# Patient Record
Sex: Female | Born: 1945 | Race: Black or African American | Hispanic: No | Marital: Married | Smoking: Never smoker
Health system: Southern US, Community
[De-identification: ages and names within clinical notes are randomized; demographics above are authoritative.]

## PROBLEM LIST (undated history)

## (undated) DIAGNOSIS — N63 Unspecified lump in unspecified breast: Secondary | ICD-10-CM

## (undated) HISTORY — PX: BREAST EXCISIONAL BIOPSY: SUR124

## (undated) HISTORY — PX: ABDOMINAL HYSTERECTOMY: SHX81

---

## 1987-11-07 DIAGNOSIS — N63 Unspecified lump in unspecified breast: Secondary | ICD-10-CM

## 1987-11-07 HISTORY — DX: Unspecified lump in unspecified breast: N63.0

## 2004-10-17 ENCOUNTER — Ambulatory Visit: Payer: Self-pay | Admitting: Internal Medicine

## 2005-10-24 ENCOUNTER — Ambulatory Visit: Payer: Self-pay | Admitting: Internal Medicine

## 2006-11-21 ENCOUNTER — Ambulatory Visit: Payer: Self-pay | Admitting: Internal Medicine

## 2007-08-13 ENCOUNTER — Ambulatory Visit: Payer: Self-pay | Admitting: Family Medicine

## 2007-11-27 ENCOUNTER — Ambulatory Visit: Payer: Self-pay | Admitting: Internal Medicine

## 2008-07-03 ENCOUNTER — Ambulatory Visit: Payer: Self-pay | Admitting: Surgery

## 2009-12-07 ENCOUNTER — Emergency Department: Payer: Self-pay | Admitting: Emergency Medicine

## 2009-12-14 ENCOUNTER — Ambulatory Visit: Payer: Self-pay | Admitting: Nurse Practitioner

## 2011-02-28 ENCOUNTER — Ambulatory Visit: Payer: Self-pay | Admitting: Internal Medicine

## 2011-08-22 ENCOUNTER — Ambulatory Visit: Payer: Self-pay

## 2012-03-11 ENCOUNTER — Ambulatory Visit: Payer: Self-pay | Admitting: Internal Medicine

## 2012-10-08 ENCOUNTER — Ambulatory Visit: Payer: Self-pay | Admitting: Family Medicine

## 2013-12-17 ENCOUNTER — Ambulatory Visit: Payer: Self-pay | Admitting: Family Medicine

## 2014-04-13 DIAGNOSIS — I340 Nonrheumatic mitral (valve) insufficiency: Secondary | ICD-10-CM | POA: Insufficient documentation

## 2014-04-21 DIAGNOSIS — R9431 Abnormal electrocardiogram [ECG] [EKG]: Secondary | ICD-10-CM | POA: Insufficient documentation

## 2016-05-01 ENCOUNTER — Other Ambulatory Visit: Payer: Self-pay | Admitting: Family Medicine

## 2016-05-01 DIAGNOSIS — Z1231 Encounter for screening mammogram for malignant neoplasm of breast: Secondary | ICD-10-CM

## 2016-05-15 ENCOUNTER — Ambulatory Visit
Admission: RE | Admit: 2016-05-15 | Discharge: 2016-05-15 | Disposition: A | Discharge status: Home / Self Care | Payer: Medicare PPO | Source: Ambulatory Visit | Attending: Family Medicine | Admitting: Family Medicine

## 2016-05-15 DIAGNOSIS — Z1231 Encounter for screening mammogram for malignant neoplasm of breast: Secondary | ICD-10-CM | POA: Insufficient documentation

## 2016-05-15 HISTORY — DX: Unspecified lump in unspecified breast: N63.0

## 2017-12-25 ENCOUNTER — Other Ambulatory Visit: Payer: Self-pay | Admitting: Family Medicine

## 2017-12-25 DIAGNOSIS — Z1231 Encounter for screening mammogram for malignant neoplasm of breast: Secondary | ICD-10-CM

## 2017-12-31 ENCOUNTER — Encounter (INDEPENDENT_AMBULATORY_CARE_PROVIDER_SITE_OTHER): Payer: Self-pay

## 2017-12-31 ENCOUNTER — Ambulatory Visit
Admission: RE | Admit: 2017-12-31 | Discharge: 2017-12-31 | Disposition: A | Discharge status: Home / Self Care | Payer: Medicare PPO | Source: Ambulatory Visit | Attending: Family Medicine | Admitting: Family Medicine

## 2017-12-31 DIAGNOSIS — Z1231 Encounter for screening mammogram for malignant neoplasm of breast: Secondary | ICD-10-CM | POA: Insufficient documentation

## 2017-12-31 DIAGNOSIS — R921 Mammographic calcification found on diagnostic imaging of breast: Secondary | ICD-10-CM | POA: Insufficient documentation

## 2018-01-03 ENCOUNTER — Other Ambulatory Visit: Payer: Self-pay | Admitting: Family Medicine

## 2018-01-03 DIAGNOSIS — R928 Other abnormal and inconclusive findings on diagnostic imaging of breast: Secondary | ICD-10-CM

## 2018-01-03 DIAGNOSIS — R921 Mammographic calcification found on diagnostic imaging of breast: Secondary | ICD-10-CM

## 2018-01-15 ENCOUNTER — Ambulatory Visit
Admission: RE | Admit: 2018-01-15 | Discharge: 2018-01-15 | Disposition: A | Discharge status: Home / Self Care | Payer: Medicare PPO | Source: Ambulatory Visit | Attending: Family Medicine | Admitting: Family Medicine

## 2018-01-15 DIAGNOSIS — R928 Other abnormal and inconclusive findings on diagnostic imaging of breast: Secondary | ICD-10-CM

## 2018-01-15 DIAGNOSIS — R921 Mammographic calcification found on diagnostic imaging of breast: Secondary | ICD-10-CM | POA: Diagnosis not present

## 2018-01-16 ENCOUNTER — Other Ambulatory Visit: Payer: Self-pay | Admitting: Family Medicine

## 2018-01-16 DIAGNOSIS — R928 Other abnormal and inconclusive findings on diagnostic imaging of breast: Secondary | ICD-10-CM

## 2018-01-16 DIAGNOSIS — R921 Mammographic calcification found on diagnostic imaging of breast: Secondary | ICD-10-CM

## 2018-01-24 ENCOUNTER — Ambulatory Visit
Admission: RE | Admit: 2018-01-24 | Discharge: 2018-01-24 | Disposition: A | Discharge status: Home / Self Care | Payer: Medicare PPO | Source: Ambulatory Visit | Attending: Family Medicine | Admitting: Family Medicine

## 2018-01-24 DIAGNOSIS — R928 Other abnormal and inconclusive findings on diagnostic imaging of breast: Secondary | ICD-10-CM

## 2018-01-24 DIAGNOSIS — R921 Mammographic calcification found on diagnostic imaging of breast: Secondary | ICD-10-CM

## 2018-01-24 HISTORY — PX: BREAST BIOPSY: SHX20

## 2018-01-25 LAB — SURGICAL PATHOLOGY

## 2018-03-04 DIAGNOSIS — H2510 Age-related nuclear cataract, unspecified eye: Secondary | ICD-10-CM | POA: Insufficient documentation

## 2018-03-04 DIAGNOSIS — H35341 Macular cyst, hole, or pseudohole, right eye: Secondary | ICD-10-CM | POA: Insufficient documentation

## 2018-03-04 DIAGNOSIS — H35372 Puckering of macula, left eye: Secondary | ICD-10-CM | POA: Insufficient documentation

## 2019-11-10 ENCOUNTER — Other Ambulatory Visit: Payer: Self-pay | Admitting: Family Medicine

## 2019-11-10 DIAGNOSIS — Z780 Asymptomatic menopausal state: Secondary | ICD-10-CM

## 2019-11-11 ENCOUNTER — Other Ambulatory Visit: Payer: Self-pay

## 2019-11-11 ENCOUNTER — Ambulatory Visit: Admission: RE | Admit: 2019-11-11 | Payer: Self-pay | Source: Ambulatory Visit

## 2019-11-13 ENCOUNTER — Other Ambulatory Visit: Payer: Self-pay | Admitting: Family Medicine

## 2019-11-19 ENCOUNTER — Other Ambulatory Visit: Payer: Self-pay | Admitting: Family Medicine

## 2019-11-19 DIAGNOSIS — R921 Mammographic calcification found on diagnostic imaging of breast: Secondary | ICD-10-CM

## 2020-02-12 ENCOUNTER — Ambulatory Visit
Admission: RE | Admit: 2020-02-12 | Discharge: 2020-02-12 | Disposition: A | Discharge status: Home / Self Care | Payer: Medicare HMO | Source: Ambulatory Visit | Attending: Family Medicine | Admitting: Family Medicine

## 2020-02-12 DIAGNOSIS — R921 Mammographic calcification found on diagnostic imaging of breast: Secondary | ICD-10-CM

## 2021-02-24 ENCOUNTER — Other Ambulatory Visit: Payer: Self-pay

## 2021-02-24 DIAGNOSIS — Z1231 Encounter for screening mammogram for malignant neoplasm of breast: Secondary | ICD-10-CM

## 2021-03-01 ENCOUNTER — Other Ambulatory Visit: Payer: Self-pay

## 2021-03-01 DIAGNOSIS — Z780 Asymptomatic menopausal state: Secondary | ICD-10-CM

## 2021-03-11 ENCOUNTER — Ambulatory Visit
Admission: RE | Admit: 2021-03-11 | Discharge: 2021-03-11 | Disposition: A | Discharge status: Home / Self Care | Payer: Medicare HMO | Source: Ambulatory Visit

## 2021-03-11 ENCOUNTER — Other Ambulatory Visit: Payer: Self-pay

## 2021-03-11 DIAGNOSIS — Z1231 Encounter for screening mammogram for malignant neoplasm of breast: Secondary | ICD-10-CM

## 2022-02-21 DIAGNOSIS — I10 Essential (primary) hypertension: Secondary | ICD-10-CM | POA: Insufficient documentation

## 2022-02-26 ENCOUNTER — Ambulatory Visit (INDEPENDENT_AMBULATORY_CARE_PROVIDER_SITE_OTHER): Payer: Medicare HMO

## 2022-02-26 ENCOUNTER — Ambulatory Visit
Admission: EM | Admit: 2022-02-26 | Discharge: 2022-02-26 | Disposition: A | Discharge status: Home / Self Care | Payer: Medicare HMO

## 2022-02-26 DIAGNOSIS — M79605 Pain in left leg: Secondary | ICD-10-CM

## 2022-02-26 MED ORDER — MELOXICAM 7.5 MG PO TABS: 7.5000 mg | ORAL_TABLET | Freq: Every day | ORAL | 0 refills | Status: AC

## 2022-02-26 NOTE — Discharge Instructions (Addendum)
Stop the Aleeve and take the Mobic instead for 7 days ?Get back on your blood pressure medication today ?Follow up with orthopedics tomorrow.  ?

## 2022-02-26 NOTE — ED Provider Notes (Signed)
?[facility] ? ? ? ?CSN: [id number] ?Arrival date & time: 02/26/22  1425 ? ? ?  ? ?History   ?Chief Complaint ?Chief Complaint  ?Patient presents with  ? Leg Pain  ? ? ?HPI ?Brittany Delacruz is a 76 y.o. female who presents with her daughter due to having L anterior upper thigh pain which is provoked with standing up since 2 days ago. She has been doing a lot of yard work, lifting bags up and down, but does not recall specific injury. She denies back pain.  ? ? ? ?Past Medical History:  ?Diagnosis Date  ? Breast mass 1989  ? bump on left breast that they removed and it was negative  ? ? ?Patient Active Problem List  ? Diagnosis Date Noted  ? Essential hypertension 02/21/2022  ? 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 Epiretinal membrane (ERM) of left eye 03/04/2018  ? Abnormal ECG 04/21/2014  ? Non-rheumatic mitral regurgitation 04/13/2014  ? ? ?Past Surgical History:  ?Procedure Laterality Date  ? ABDOMINAL HYSTERECTOMY    ? BREAST BIOPSY Right 01/24/2018  ? HYALINIZED FIBROADENOMATOUS CHANGE WITH COARSE CALCIFICATION  ? BREAST EXCISIONAL BIOPSY Left   ? neg  ? ? ?OB History   ?No obstetric history on file. ?  ? ? ? ?Home Medications   ? ?Prior to Admission medications   ?Medication Sig Start Date End Date Taking? Authorizing Provider  ?acetaminophen (TYLENOL) 500 MG tablet Take by mouth. Last dose: 500mg , Am.   Yes [provider]  ?aspirin 81 MG EC tablet Take by mouth.   Yes [provider]  ?atorvastatin (LIPITOR) 40 MG tablet Take by mouth. 02/21/22 02/21/23 Yes [provider]  ?chlorthalidone (HYGROTON) 25 MG tablet Take 1 tablet by mouth daily. 02/21/22 02/21/23 Yes [provider]  ?meloxicam (MOBIC) 7.5 MG tablet Take 1 tablet (7.5 mg total) by mouth daily. 02/26/22  Yes Rodriguez-Southworth, Nettie ElmSylvia, PA-C  ?Multiple Vitamin (MULTIVITAMIN) capsule Take 1 capsule by mouth daily.    [provider]  ?Omega-3 Fatty  Acids (RA FISH OIL) 1000 MG CAPS Take by mouth.    [provider]  ? ? ?Family History ?Family History  ?Problem Relation Age of Onset  ? Breast cancer Neg Hx   ? ? ?Social History ?Social History  ? ?Tobacco Use  ? Smoking status: Never  ?  Passive exposure: Never  ? Smokeless tobacco: Never  ?Vaping Use  ? Vaping Use: Never used  ?Substance Use Topics  ? Alcohol use: Not Currently  ? Drug use: Never  ? ? ? ?Allergies   ?Losartan ? ? ?Review of Systems ?Review of Systems  ?Cardiovascular:  Negative for leg swelling.  ?Musculoskeletal:  Positive for gait problem. Negative for arthralgias, back pain and joint swelling.  ?     + pain L upper thigh area  ?Skin:  Negative for rash and wound.  ?Neurological:  Negative for weakness and numbness.  ? ? ?Physical Exam ?Triage Vital Signs ?ED Triage Vitals  ?Enc Vitals Group  ?   BP 02/26/22 1438 (!) 176/76  ?   Pulse Rate 02/26/22 1438 83  ?   Resp 02/26/22 1438 20  ?   Temp 02/26/22 1438 97.7 ?F (36.5 ?C)  ?   Temp Source 02/26/22 1438 Oral  ?   SpO2 02/26/22 1438 97 %  ?   Weight 02/26/22 1435 194 lb (88 kg)  ?   Height 02/26/22 1435  5\' 6"  (1.676 m)  ?   Head Circumference --   ?   Peak Flow --   ?   Pain Score 02/26/22 1432 0  ?   Pain Loc --   ?   Pain Edu? --   ?   Excl. in GC? --   ? ?No data found. ? ?Updated Vital Signs ?BP (!) 164/82 (BP Location: Left Arm) Comment: Per request  Pulse 83   Temp 97.7 ?F (36.5 ?C) (Oral)   Resp 20   Ht 5\' 6"  (1.676 m)   Wt 194 lb (88 kg)   SpO2 97%   BMI 31.31 kg/m?  ? ?Visual Acuity ?Right Eye Distance:   ?Left Eye Distance:   ?Bilateral Distance:   ? ?Right Eye Near:   ?Left Eye Near:    ?Bilateral Near:    ? ?Physical Exam ?Vitals and nursing note reviewed.  ?Constitutional:   ?   General: She is not in acute distress. ?   Appearance: She is not toxic-appearing.  ?HENT:  ?   Right Ear: External ear normal.  ?   Left Ear: External ear normal.  ?Eyes:  ?   General: No scleral icterus. ?   Conjunctiva/sclera:  Conjunctivae normal.  ?Pulmonary:  ?   Effort: Pulmonary effort is normal.  ?Musculoskeletal:     ?   General: Normal range of motion.  ?   Cervical back: Neck supple.  ?   Comments: L HIP/THIGH-Does not have tenderness on area of complaint with palpation or hip ROM.  ?But when she stood up, this area was palpated and is tender on L upper anterior thigh region.  ?L KNEE- with normal ROM and non tender ?L ANKLE- normal   ?Skin: ?   General: Skin is warm and dry.  ?Neurological:  ?   Mental Status: She is alert and oriented to person, place, and time.  ?   Gait: Gait normal.  ?   Comments: Strength of l leg is 5/5 and did not provoke pain.   ?Psychiatric:     ?   Mood and Affect: Mood normal.     ?   Behavior: Behavior normal.     ?   Thought Content: Thought content normal.     ?   Judgment: Judgment normal.  ? ? ? ?UC Treatments / Results  ?Labs ?(all labs ordered are listed, but only abnormal results are displayed) ?Labs Reviewed - No data to display ? ?EKG ? ? ?Radiology ?DG Femur Min 2 Views Left ? ?Result Date: 02/26/2022 ?CLINICAL DATA:  Left leg pain.  Hip pain shooting down left leg. EXAM: LEFT FEMUR 2 VIEWS COMPARISON:  None. FINDINGS: The left femoroacetabular joint space is maintained. Minimal pubic symphysis joint space narrowing and superior osteophytosis. No acute fracture is seen. No dislocation. Minimal inferior patellar degenerative osteophytosis. No knee joint effusion. Vascular phleboliths overlie the pelvis. IMPRESSION: No acute fracture.  No significant left hip osteoarthritis. Electronically Signed   By: M.D.   On: 02/26/2022 15:33   ? ?Procedures ?Procedures (including critical care time) ? ?Medications Ordered in UC ?Medications - No data to display ? ?Initial Impression / Assessment and Plan / UC Course  ?I have reviewed the triage vital signs and the nursing notes. ? ?Pertinent  imaging results that were available during my care of the patient were reviewed by me and considered  in my medical decision making (see chart for details). ? ?L anterior thigh pain when  standing only of unknown cause, but could be from OA of L patella. ?I placed her on Mobic and she needs to Fu with ortho this week.  ? ? ?Final Clinical Impressions(s) / UC Diagnoses  ? ?Final diagnoses:  ?Left leg pain  ? ? ? ?Discharge Instructions   ? ?  ?Stop the Aleeve and take the Mobic instead for 7 days ?Get back on your blood pressure medication today ?Follow up with orthopedics tomorrow.  ? ? ? ? ?ED Prescriptions   ? ? Medication Sig Dispense Auth. Provider  ? meloxicam (MOBIC) 7.5 MG tablet Take 1 tablet (7.5 mg total) by mouth daily. 7 tablet Rodriguez-Southworth, Nettie Elm, PA-C  ? ?  ? ?PDMP not reviewed this encounter. ?  ?Garey Ham, PA-C ?02/26/22 1545 ? ?

## 2022-02-26 NOTE — ED Triage Notes (Signed)
Patient is here for "Left leg pain". Start's at left hip and shoots down left leg. "Been doing a lot of things at home, in the yard". The pain today started being sharp.  ?

## 2022-06-10 IMAGING — CR DG FEMUR 2+V*L*
5 series · 5 of 5 positions shown · non-contrast
Comparison: None.

CLINICAL DATA: Left leg pain.  Hip pain shooting down left leg.

EXAM:
LEFT FEMUR 2 VIEWS

[femur ap (1 of 2)]
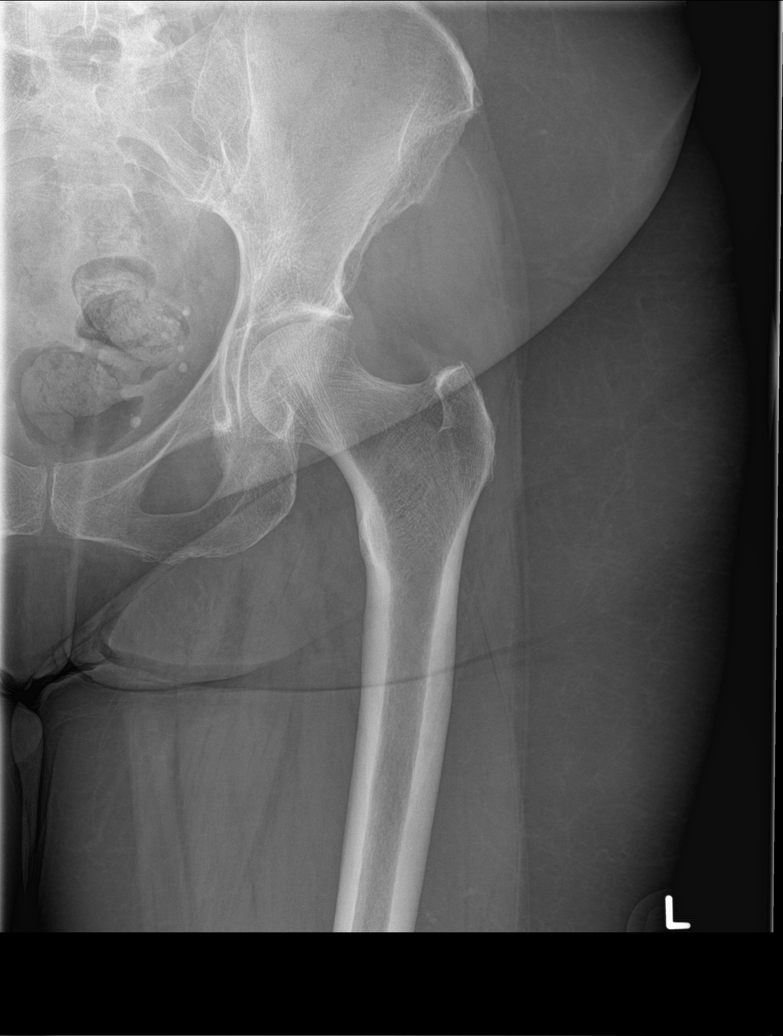

[femur ap (2 of 2)]
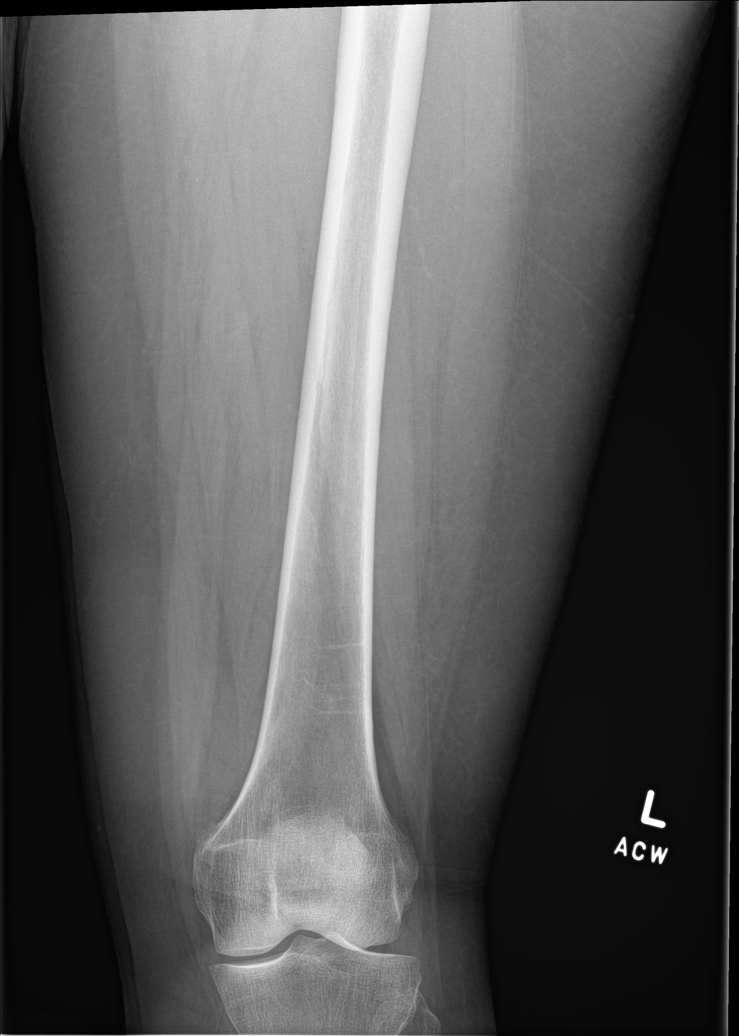

[femur lat (1 of 3)]
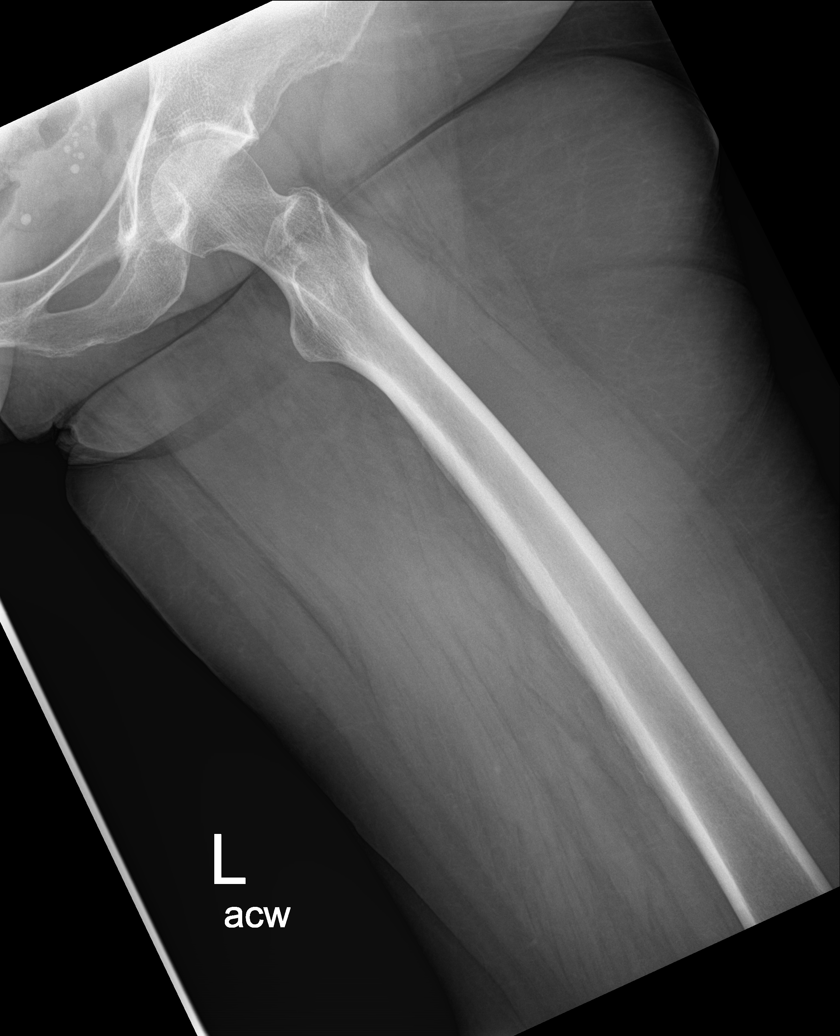

[femur lat (2 of 3)]
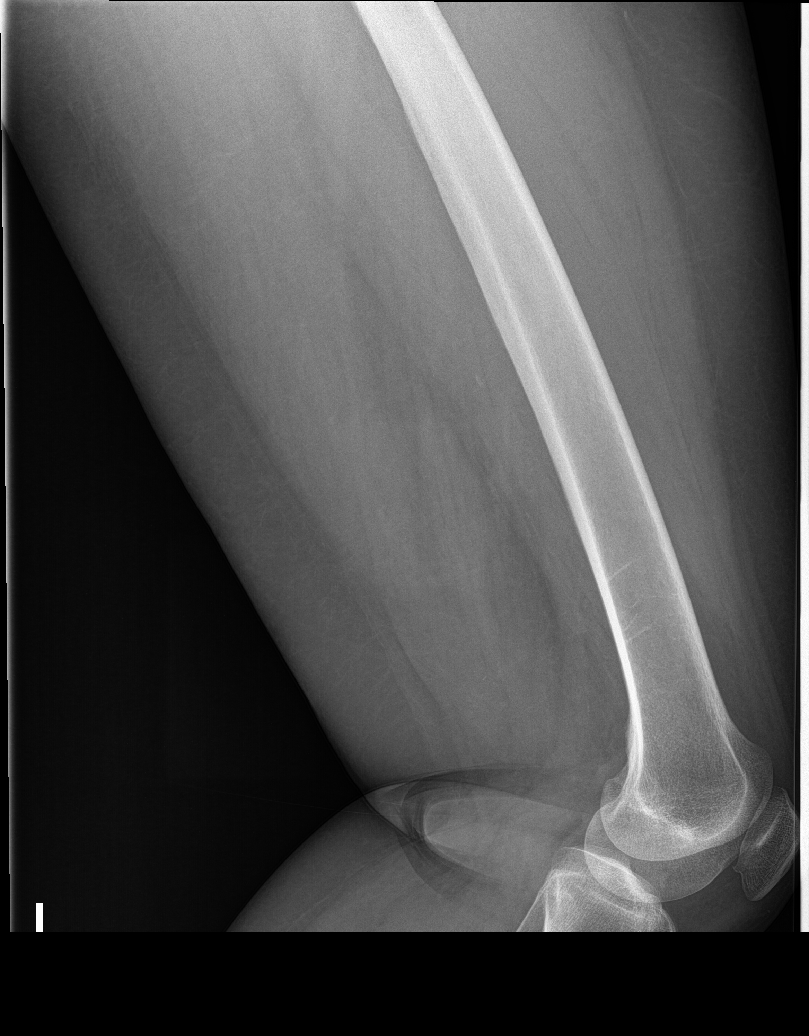

[femur lat (3 of 3)]
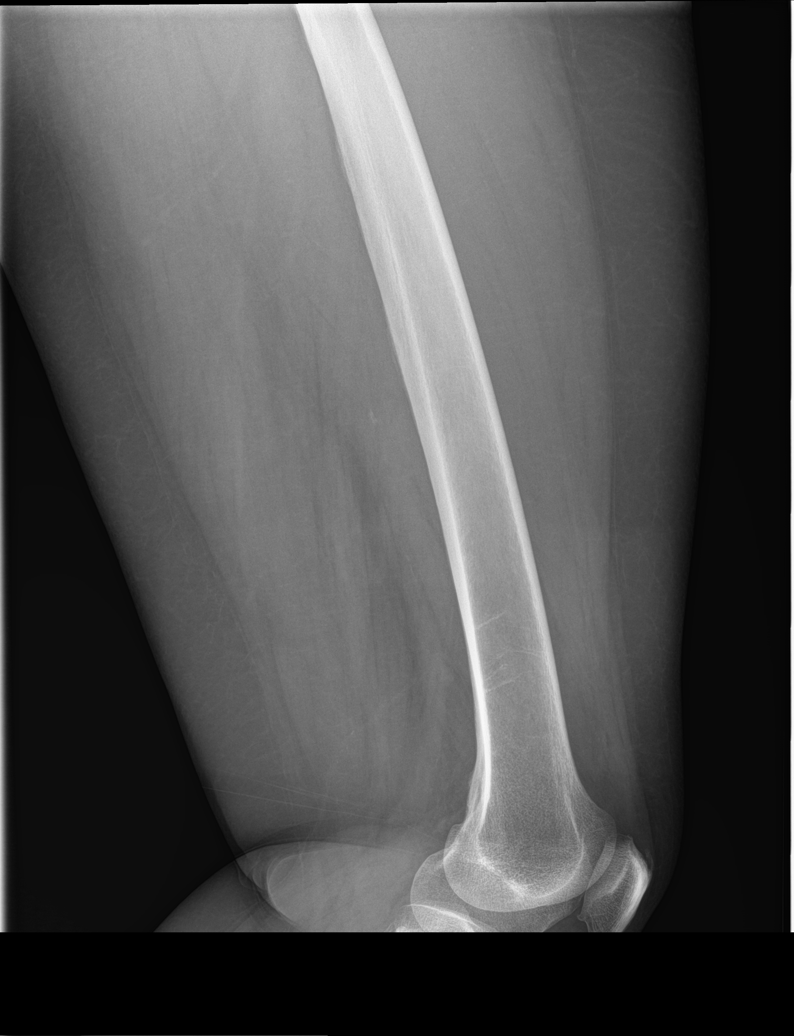

[5 of 5 positions shown; findings below may reference images not displayed]

FINDINGS: The left femoroacetabular joint space is maintained. Minimal pubic
symphysis joint space narrowing and superior osteophytosis. No acute
fracture is seen. No dislocation. Minimal inferior patellar
degenerative osteophytosis. No knee joint effusion. Vascular
phleboliths overlie the pelvis.
IMPRESSION: No acute fracture.  No significant left hip osteoarthritis.

## 2022-08-19 ENCOUNTER — Ambulatory Visit
Admission: EM | Admit: 2022-08-19 | Discharge: 2022-08-19 | Disposition: A | Discharge status: Home / Self Care | Payer: Medicare HMO | Attending: Physician Assistant | Admitting: Physician Assistant

## 2022-08-19 DIAGNOSIS — H1132 Conjunctival hemorrhage, left eye: Secondary | ICD-10-CM

## 2022-08-19 NOTE — ED Provider Notes (Signed)
[facility]    CSN: [id number] Arrival date & time: 08/19/22  0809      History   Chief Complaint Chief Complaint  Patient presents with   Eye Problem    HPI Brittany Delacruz is a 76 y.o. female presenting for left eye redness that she noticed this morning.  She says she woke up at about 4:30-5 am and felt the pain in her eye.  She does not know if the pain woke her up or if she felt the pain after waking up.  She says the pain lasted for about a minute and then went away.  She reports she looked at her eye and the white part was all red.  She does not recall an injury.  States she felt fine yesterday and was not having any issues.  She denies any change in her vision.  She has not had any headaches and does not have any eye pain now.  She denies dizziness, photophobia, presyncope or syncope, numbness/tingling or weakness, facial drooping or speech problems.  Patient does have some chronic eye issues but says it usually involves her right eye.  She says she has not had any trouble with her left eye and does see ophthalmology regularly.  Her last visit was 5 months ago.  She has no other complaints today and says she feels just fine.  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      Home Medications    Prior to Admission  medications   Medication Sig Start Date End Date Taking? Authorizing Provider  acetaminophen (TYLENOL) 500 MG tablet Take by mouth. Last dose: 500mg , Am.   Yes [provider]  aspirin 81 MG EC tablet Take by mouth.   Yes [provider]  atorvastatin (LIPITOR) 40 MG tablet Take by mouth. 02/21/22 02/21/23  [provider]  chlorthalidone (HYGROTON) 25 MG tablet Take 1 tablet by mouth daily. 02/21/22 02/21/23  [provider]  meloxicam (MOBIC) 7.5 MG tablet Take 1 tablet (7.5 mg total) by mouth daily. 02/26/22   Rodriguez-Southworth, Sunday Spillers, PA-C  Multiple Vitamin (MULTIVITAMIN) capsule Take 1 capsule by mouth daily.    [provider]  Omega-3 Fatty Acids (RA FISH OIL) 1000 MG CAPS Take by mouth.    [provider]    Family History Family History  Problem Relation Age of Onset   Breast cancer Neg Hx     Social History Social History   Tobacco Use   Smoking status: Never    Passive exposure: Never   Smokeless tobacco: Never  Vaping Use   Vaping Use: Never used  Substance Use Topics   Alcohol use: Not Currently   Drug use: Never     Allergies   Losartan   Review of Systems  Review of Systems  Constitutional:  Negative for fatigue.  Eyes:  Positive for redness. Negative for photophobia, pain, discharge and visual disturbance.  Respiratory:  Negative for shortness of breath.   Cardiovascular:  Negative for chest pain.  Gastrointestinal:  Negative for nausea and vomiting.  Skin:  Negative for wound.  Neurological:  Negative for dizziness, syncope, facial asymmetry, speech difficulty, weakness, light-headedness, numbness and headaches.  Psychiatric/Behavioral:  Negative for confusion.      Physical Exam Triage Vital Signs ED Triage Vitals  Enc Vitals Group     BP 08/19/22 0853 (!) 160/94     Pulse Rate 08/19/22 0853 76     Resp 08/19/22 0853 18     Temp 08/19/22 0853 98.3 F (36.8 C)     Temp Source 08/19/22 0853  Oral     SpO2 08/19/22 0853 96 %     Weight 08/19/22 0852 190 lb (86.2 kg)     Height 08/19/22 0852 5\' 7"  (1.702 m)     Head Circumference --      Peak Flow --      Pain Score 08/19/22 0852 0     Pain Loc --      Pain Edu? --      Excl. in GC? --    No data found.  Updated Vital Signs BP (!) 160/94 (BP Location: Left Arm)   Pulse 76   Temp 98.3 F (36.8 C) (Oral)   Resp 18   Ht 5\' 7"  (1.702 m)   Wt 190 lb (86.2 kg)   SpO2 96%   BMI 29.76 kg/m   Visual Acuity Right Eye Distance: 20/40 Left Eye Distance: 20/30 Bilateral Distance: 20/25    Bilateral Near:   (Pt Wears Glasses)  Physical Exam Vitals and nursing note reviewed.  Constitutional:      General: She is not in acute distress.    Appearance: Normal appearance. She is not ill-appearing or toxic-appearing.  HENT:     Head: Normocephalic and atraumatic.     Nose: Nose normal.     Mouth/Throat:     Mouth: Mucous membranes are moist.     Pharynx: Oropharynx is clear.  Eyes:     General: Lids are normal. Lids are everted, no foreign bodies appreciated. Vision grossly intact. No scleral icterus.       Right eye: No discharge.        Left eye: No discharge.     Conjunctiva/sclera:     Left eye: Hemorrhage present.     Comments: Subconjunctival hemorrhage of the left eye covering about 75 to 80%.  No hemorrhage over iris or pupil.  Cardiovascular:     Rate and Rhythm: Normal rate and regular rhythm.     Heart sounds: Normal heart sounds.  Pulmonary:     Effort: Pulmonary effort is normal. No respiratory distress.     Breath sounds: Normal breath sounds.  Musculoskeletal:     Cervical back: Neck supple.  Skin:    General: Skin is dry.  Neurological:     General: No focal deficit present.     Mental Status: She is alert and oriented to person, place, and time. Mental status is at baseline.     Cranial Nerves: No cranial nerve deficit.     Motor: No weakness.     Gait: Gait normal.  Psychiatric:        Mood  and Affect: Mood normal.        Behavior: Behavior normal.  Thought Content: Thought content normal.      UC Treatments / Results  Labs (all labs ordered are listed, but only abnormal results are displayed) Labs Reviewed - No data to display  EKG   Radiology No results found.  Procedures Procedures (including critical care time)  Medications Ordered in UC Medications - No data to display  Initial Impression / Assessment and Plan / UC Course  I have reviewed the triage vital signs and the nursing notes.  Pertinent labs & imaging results that were available during my care of the patient were reviewed by me and considered in my medical decision making (see chart for details).   76 year old female presenting for left eyes subconjunctival hemorrhage.  Reports that she woke up this morning and felt a sharp pain in her left eye that went away after about a minute.  This was about 4 to 5 hours ago.  She has not had any pain since.She denies dizziness, photophobia, presyncope or syncope, numbness/tingling or weakness, facial drooping or speech problems.  Patient does have some chronic eye issues but says it usually involves her right eye.  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  Patient denies any diagnosis of glaucoma and does not have any treatment for this.  She reports that she can contact her ophthalmologist first thing on Monday to follow-up.  She goes to [facility] in [place] glaucoma clinic.  She reports that she can actually contact the nurse line today.  Her blood pressure is elevated at 160/94.  Other vitals are normal and stable.  Vision is intact.  20/30 in left eye.  Wearing glasses.  Her exam reveals a large subconjunctival hemorrhage of the left eye, otherwise normal.  PERRLA and normal cranial nerve exam.  Advised patient I am unsure as to the cause of her subconjunctival hemorrhage.  Could be related to  glaucoma, aneurysm, injuries such as rubbing eye, elevated blood pressure, etc.  Since patient is denying any red flag signs or symptoms, feel it is safe for her to follow-up with the [facility] on Monday.  However, I discussed ED red flag signs and symptoms with her and advised her if any of those occur she is to go to the emergency department for immediate ophthalmology consult and potentially imaging.  Patient is agreeable to plan.   Final Clinical Impressions(s) / UC Diagnoses   Final diagnoses:  Subconjunctival hemorrhage of left eye     Discharge Instructions      -You have a subconjunctival hemorrhage of your eye.  This is a broken blood vessel.  I did just do not know what caused the subconjunctival hemorrhage.  It could potentially be related to elevated pressure in your eye.  We also discussed possibility of an aneurysm or potentially you rubbing your eye and causing the blood vessel to burst. - You do not have any symptoms at this time so I would just contact your ophthalmologist on Monday but if you do develop any symptoms such as headache, eye pain, dizziness, change in vision, nausea/vomiting, numbness/tingling or weakness, neck pain, feeling faint or passing out, chest pain, breathing difficulty you should go immediately to the emergency department as you will need immediate consult with ophthalmology and likely CT imaging of your head. - We discussed that this will take some time to heal.  As long as it is not clouding your vision hide is good.  You may close your eye and ice your eye.  ED Prescriptions   None    PDMP not reviewed this encounter.   Shirlee Latch, PA-C 08/19/22 [id number]

## 2022-08-19 NOTE — ED Triage Notes (Signed)
Pt c/o red left eye x1day.  Pt denies any blurry vision or trouble seeing. Pt states that she felt a sharp pain.   Pt denies any head injury or stress recently.   Pt states that she normally wakes up in the morning at 4:30am but today she felt pain in the left eye and that is how she woke up.  Pt has photos of her eye

## 2022-08-19 NOTE — Discharge Instructions (Addendum)
-  You have a subconjunctival hemorrhage of your eye.  This is a broken blood vessel.  I did just do not know what caused the subconjunctival hemorrhage.  It could potentially be related to elevated pressure in your eye.  We also discussed possibility of an aneurysm or potentially you rubbing your eye and causing the blood vessel to burst. - You do not have any symptoms at this time so I would just contact your ophthalmologist on Monday but if you do develop any symptoms such as headache, eye pain, dizziness, change in vision, nausea/vomiting, numbness/tingling or weakness, neck pain, feeling faint or passing out, chest pain, breathing difficulty you should go immediately to the emergency department as you will need immediate consult with ophthalmology and likely CT imaging of your head. - We discussed that this will take some time to heal.  As long as it is not clouding your vision hide is good.  You may close your eye and ice your eye.

## 2023-09-17 ENCOUNTER — Other Ambulatory Visit: Payer: Self-pay | Admitting: Family Medicine

## 2023-09-17 DIAGNOSIS — Z1231 Encounter for screening mammogram for malignant neoplasm of breast: Secondary | ICD-10-CM
